# Patient Record
Sex: Male | Born: 1965 | Race: White | Hispanic: No | Marital: Married | Smoking: Never smoker
Health system: Southern US, Community
[De-identification: ages and names within clinical notes are randomized; demographics above are authoritative.]

## PROBLEM LIST (undated history)

## (undated) DIAGNOSIS — M5126 Other intervertebral disc displacement, lumbar region: Secondary | ICD-10-CM

## (undated) DIAGNOSIS — G43909 Migraine, unspecified, not intractable, without status migrainosus: Secondary | ICD-10-CM

## (undated) DIAGNOSIS — T7840XA Allergy, unspecified, initial encounter: Secondary | ICD-10-CM

## (undated) HISTORY — DX: Other intervertebral disc displacement, lumbar region: M51.26

## (undated) HISTORY — DX: Migraine, unspecified, not intractable, without status migrainosus: G43.909

## (undated) HISTORY — DX: Allergy, unspecified, initial encounter: T78.40XA

---

## 1999-05-13 ENCOUNTER — Other Ambulatory Visit: Admission: RE | Admit: 1999-05-13 | Discharge: 1999-05-22 | Payer: Self-pay | Admitting: Psychiatry

## 2000-06-25 ENCOUNTER — Encounter: Payer: Self-pay | Admitting: Family Medicine

## 2000-06-25 ENCOUNTER — Ambulatory Visit (HOSPITAL_COMMUNITY): Admission: RE | Admit: 2000-06-25 | Discharge: 2000-06-25 | Payer: Self-pay | Admitting: Family Medicine

## 2002-08-12 ENCOUNTER — Encounter: Payer: Self-pay | Admitting: Family Medicine

## 2002-08-12 ENCOUNTER — Ambulatory Visit (HOSPITAL_COMMUNITY): Admission: RE | Admit: 2002-08-12 | Discharge: 2002-08-12 | Payer: Self-pay | Admitting: Family Medicine

## 2003-07-26 ENCOUNTER — Ambulatory Visit (HOSPITAL_COMMUNITY): Admission: RE | Admit: 2003-07-26 | Discharge: 2003-07-26 | Payer: Self-pay | Admitting: Family Medicine

## 2016-08-15 ENCOUNTER — Other Ambulatory Visit: Payer: Self-pay | Admitting: Family Medicine

## 2016-08-15 ENCOUNTER — Ambulatory Visit
Admission: RE | Admit: 2016-08-15 | Discharge: 2016-08-15 | Disposition: A | Discharge status: Home / Self Care | Payer: BC Managed Care – PPO | Source: Ambulatory Visit | Attending: Family Medicine | Admitting: Family Medicine

## 2016-08-15 DIAGNOSIS — M25561 Pain in right knee: Secondary | ICD-10-CM

## 2021-03-18 ENCOUNTER — Ambulatory Visit (INDEPENDENT_AMBULATORY_CARE_PROVIDER_SITE_OTHER): Payer: BC Managed Care – PPO

## 2021-03-18 ENCOUNTER — Other Ambulatory Visit: Payer: Self-pay | Admitting: *Deleted

## 2021-03-18 ENCOUNTER — Telehealth: Payer: Self-pay | Admitting: *Deleted

## 2021-03-18 DIAGNOSIS — I493 Ventricular premature depolarization: Secondary | ICD-10-CM

## 2021-03-18 DIAGNOSIS — I498 Other specified cardiac arrhythmias: Secondary | ICD-10-CM

## 2021-03-18 NOTE — Telephone Encounter (Signed)
Explained to patient ZIO patch monitor will be mailed directly to his home.  [employer] will be contacted to please expedite. Reviewed monitor instructions.

## 2021-03-18 NOTE — Progress Notes (Unsigned)
Patient enrolled for [employer] to expedite a 3 day ZIO XT monitor to address on file

## 2021-03-20 DIAGNOSIS — I493 Ventricular premature depolarization: Secondary | ICD-10-CM

## 2021-03-20 DIAGNOSIS — I498 Other specified cardiac arrhythmias: Secondary | ICD-10-CM | POA: Diagnosis not present

## 2021-04-17 DIAGNOSIS — M5136 Other intervertebral disc degeneration, lumbar region: Secondary | ICD-10-CM | POA: Insufficient documentation

## 2021-04-17 DIAGNOSIS — Z8659 Personal history of other mental and behavioral disorders: Secondary | ICD-10-CM | POA: Insufficient documentation

## 2021-04-17 DIAGNOSIS — T7840XA Allergy, unspecified, initial encounter: Secondary | ICD-10-CM | POA: Insufficient documentation

## 2021-04-17 DIAGNOSIS — M5126 Other intervertebral disc displacement, lumbar region: Secondary | ICD-10-CM | POA: Insufficient documentation

## 2021-04-17 DIAGNOSIS — G43909 Migraine, unspecified, not intractable, without status migrainosus: Secondary | ICD-10-CM | POA: Insufficient documentation

## 2021-04-18 ENCOUNTER — Ambulatory Visit (INDEPENDENT_AMBULATORY_CARE_PROVIDER_SITE_OTHER): Payer: BC Managed Care – PPO | Admitting: Internal Medicine

## 2021-04-18 ENCOUNTER — Other Ambulatory Visit: Payer: Self-pay

## 2021-04-18 VITALS — BP 124/86 | HR 73 | Ht 77.0 in | Wt 213.4 lb

## 2021-04-18 DIAGNOSIS — I493 Ventricular premature depolarization: Secondary | ICD-10-CM | POA: Diagnosis not present

## 2021-04-18 NOTE — Patient Instructions (Signed)
Medication Instructions:  Your physician recommends that you continue on your current medications as directed. Please refer to the Current Medication list given to you today.  *If you need a refill on your cardiac medications before your next appointment, please call your pharmacy*   Lab Work: None ordered.  If you have labs (blood work) drawn today and your tests are completely normal, you will receive your results only by: MyChart Message (if you have MyChart) OR A paper copy in the mail If you have any lab test that is abnormal or we need to change your treatment, we will call you to review the results.   Testing/Procedures: Your physician has requested that you have an echocardiogram. Echocardiography is a painless test that uses sound waves to create images of your heart. It provides your doctor with information about the size and shape of your heart and how well your heart's chambers and valves are working. This procedure takes approximately one hour. There are no restrictions for this procedure.    Follow-Up: At [facility], you and your health needs are our priority.  As part of our continuing mission to provide you with exceptional heart care, we have created designated Provider Care Teams.  These Care Teams include your primary Cardiologist (physician) and Advanced Practice Providers (APPs -  Physician Assistants and Nurse Practitioners) who all work together to provide you with the care you need, when you need it.  We recommend signing up for the patient portal called "MyChart".  Sign up information is provided on this After Visit Summary.  MyChart is used to connect with patients for Virtual Visits (Telemedicine).  Patients are able to view lab/test results, encounter notes, upcoming appointments, etc.  Non-urgent messages can be sent to your provider as well.   To learn more about what you can do with MyChart, go to [email or web address].    Your next appointment:    Follow up with Dr Ladona Ridgel will be based on results of echo

## 2021-04-18 NOTE — Progress Notes (Signed)
      HPI Casey Chaney is referred by Dr. Clovis Riley for evaluation of PVC's. He is a pleasant and healthy 55 yo man with a h/o PVC's diagnosed a couple of months ago. He states that he underwent a very stressful period where he was consuming more caffeine and not getting enough sleep. Workup of his palpitations demonstrated NSR and PVC's on a Zio monitor with almost 17% PVC's. He did not have any sustained or NSVT. He feels better since then. He denies syncope. No chest pain or sob.  No Known Allergies   Current Outpatient Medications  Medication Sig Dispense Refill   Ascorbic Acid (VITAMIN C) 1000 MG tablet Take 1,000 mg by mouth daily.     loratadine (CLARITIN) 10 MG tablet Take 10 mg by mouth daily.     No current facility-administered medications for this visit.     Past Medical History:  Diagnosis Date   Allergies    Bulging lumbar disc    Migraine headache     ROS:   All systems reviewed and negative except as noted in the HPI.   No family history on file.   Social History   Socioeconomic History   Marital status: Married    Spouse name: Not on file   Number of children: Not on file   Years of education: Not on file   Highest education level: Not on file  Occupational History   Not on file  Tobacco Use   Smoking status: Never   Smokeless tobacco: Not on file  Substance and Sexual Activity   Alcohol use: Not on file   Drug use: Not on file   Sexual activity: Not on file  Other Topics Concern   Not on file  Social History Narrative   Not on file   Social Determinants of Health   Financial Resource Strain: Not on file  Food Insecurity: Not on file  Transportation Needs: Not on file  Physical Activity: Not on file  Stress: Not on file  Social Connections: Not on file  Intimate Partner Violence: Not on file     BP 124/86   Pulse 73   Ht 6\' 5"  (1.956 m)   Wt 213 lb 6.4 oz (96.8 kg)   SpO2 94%   BMI 25.31 kg/m   Physical Exam:  Well  appearing NAD HEENT: Unremarkable Neck:  No JVD, no thyromegally Lymphatics:  No adenopathy Back:  No CVA tenderness Lungs:  Clear with no wheezes HEART:  Regular rate rhythm, no murmurs, no rubs, no clicks Abd:  soft, positive bowel sounds, no organomegally, no rebound, no guarding Ext:  2 plus pulses, no edema, no cyanosis, no clubbing Skin:  No rashes no nodules Neuro:  CN II through XII intact, motor grossly intact  EKG - nsr  Assess/Plan:  PVC' s- His symptoms have improved. I suspect that the PVC's were related as much as anything to high adrenalin. He will undergo 2D echo to rule out any evidence of structural heart disease. As he is essentially asymptomatic, if his EF is normal I would not recommend any specific medical therapy at this time. He will undergo watchful waiting. If his echo is abnormal then additional testing would be warranted. , MD

## 2021-05-06 ENCOUNTER — Ambulatory Visit (HOSPITAL_COMMUNITY): Payer: BC Managed Care – PPO | Attending: Cardiology

## 2021-05-06 ENCOUNTER — Other Ambulatory Visit: Payer: Self-pay

## 2021-05-06 DIAGNOSIS — I493 Ventricular premature depolarization: Secondary | ICD-10-CM | POA: Insufficient documentation

## 2021-05-06 LAB — ECHOCARDIOGRAM COMPLETE
Area-P 1/2: 2.62 cm2
S' Lateral: 3.5 cm

## 2021-05-09 ENCOUNTER — Telehealth: Payer: Self-pay | Admitting: Internal Medicine

## 2021-05-09 NOTE — Telephone Encounter (Signed)
Patient returning call for echo results. He states he is in a dead zone and to leave a voicemail with results and recommendation.

## 2021-05-09 NOTE — Telephone Encounter (Signed)
The patient has been notified of the result and verbalized understanding.  All questions (if any) were answered.  One year recall with Dr. Ladona Ridgel, placed for this pt.

## 2021-05-09 NOTE — Telephone Encounter (Signed)
-----   Message from Marinus Maw, MD sent at 05/06/2021  9:27 PM EDT ----- EF is essentially normal. Have him folllowup with me in a year provided he remains asymptomatic. Avoid caffeine and ETOH in excess.

## 2021-05-13 ENCOUNTER — Other Ambulatory Visit: Payer: Self-pay | Admitting: Family Medicine

## 2021-05-13 DIAGNOSIS — R109 Unspecified abdominal pain: Secondary | ICD-10-CM

## 2021-05-20 ENCOUNTER — Ambulatory Visit
Admission: RE | Admit: 2021-05-20 | Discharge: 2021-05-20 | Disposition: A | Discharge status: Home / Self Care | Payer: BC Managed Care – PPO | Source: Ambulatory Visit | Attending: Family Medicine | Admitting: Family Medicine

## 2021-05-20 DIAGNOSIS — R109 Unspecified abdominal pain: Secondary | ICD-10-CM

## 2021-05-29 ENCOUNTER — Telehealth: Payer: Self-pay | Admitting: *Deleted

## 2021-05-29 NOTE — Telephone Encounter (Signed)
   [facility] Pre-operative Risk Assessment    Patient Name: Casey Chaney  DOB: 02-07-1966 MRN: [id number]  HEARTCARE STAFF:  - IMPORTANT!!!!!! Under Visit Info/Reason for Call, type in Other and utilize the format Clearance MM/DD/YY or Clearance TBD. Do not use dashes or single digits. - Please review there is not already an duplicate clearance open for this procedure. - If request is for dental extraction, please clarify the # of teeth to be extracted. - If the patient is currently at the dentist's office, call Pre-Op Callback Staff (MA/nurse) to input urgent request.  - If the patient is not currently in the dentist office, please route to the Pre-Op pool.  Request for surgical clearance:  What type of surgery is being performed?  LEFT KNEE SCOPE  When is this surgery scheduled?  07/04/21  What type of clearance is required (medical clearance vs. Pharmacy clearance to hold med vs. Both)?  MEDICAL  Are there any medications that need to be held prior to surgery and how long?  N/A  Practice name and name of physician performing surgery?  [facility] / DR. LANDAU  What is the office phone number?  [phone]   7.   What is the office fax number?  [phone] ATTN:  KELLY  8.   Anesthesia type (None, local, MAC, general) ?     Jeanann Lewandowsky 05/29/2021, 10:39 AM  _________________________________________________________________   (provider comments below)

## 2021-05-29 NOTE — Telephone Encounter (Signed)
   Patient Name: Casey Chaney  DOB: 1965/12/03 MRN: [id number]  Primary Cardiologist: EP - Dr. Lewayne Bunting  Chart reviewed as part of pre-operative protocol coverage. Patient has followed with Dr. Ladona Ridgel for history of PVCs (16.8% burden) by recent monitor. 2D echo 05/06/21 showed low-normal LVEF 50-55% and mild dilation of ascending aorta. No prior ischemic eval on file. Given PVC burden and low-normal LVEF, will route to Dr. Ladona Ridgel to find out if he is comfortable clearing patient for knee surgery as planned, or if he recommends further pre-operative cardiovascular testing. Dr. Ladona Ridgel - Please route response to P CV DIV PREOP (the pre-op pool). Thank you.   Laurann Montana, PA-C 05/29/2021, 1:05 PM

## 2021-05-30 NOTE — Telephone Encounter (Signed)
May proceed with knee surgery. Acceptable risk. GT

## 2022-04-01 IMAGING — US US ABDOMEN COMPLETE
1 series · 14 of 25 positions shown · non-contrast
Comparison: None.

CLINICAL DATA: Abdominal pain for 2 months

EXAM:
ABDOMEN ULTRASOUND COMPLETE

[Series 1: us abdomen complete · 0.20mm/px · 14 of 97 slices shown]
[im 1/97]
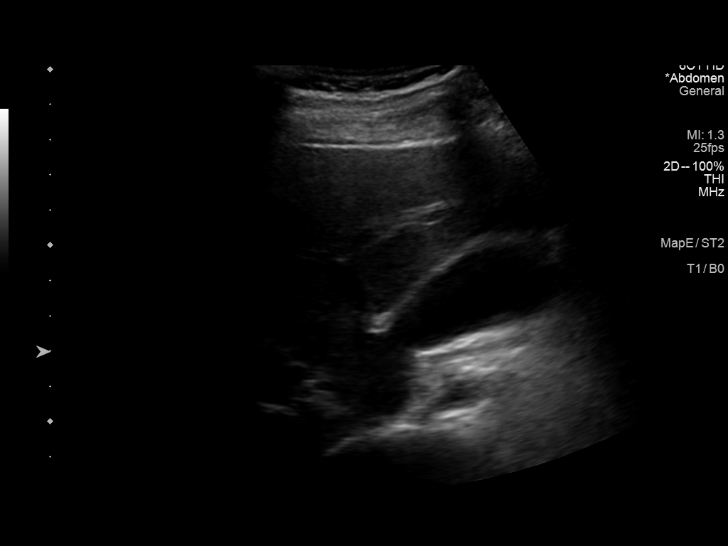
[im 9/97]
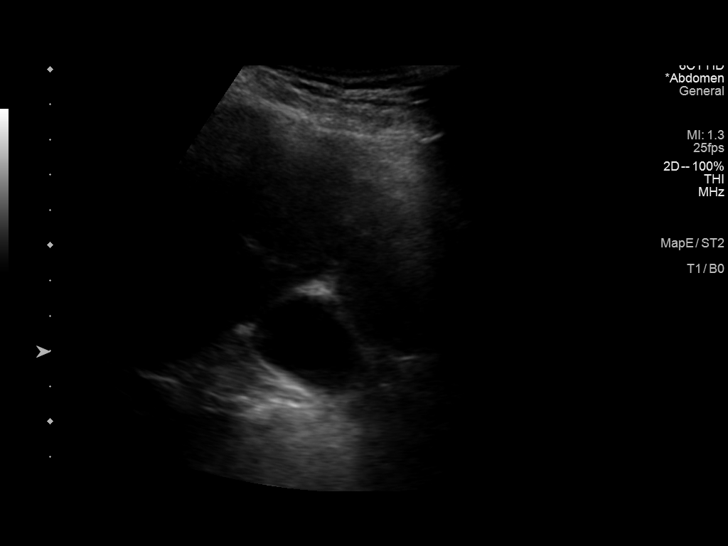
[im 17/97]
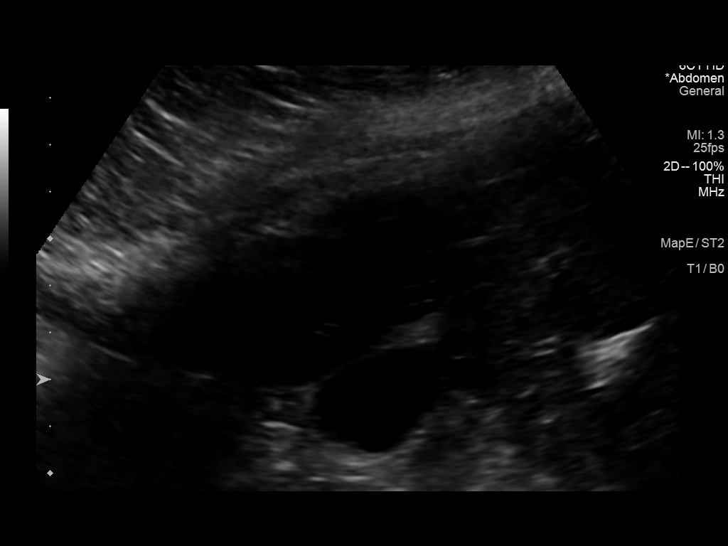
[im 25/97]
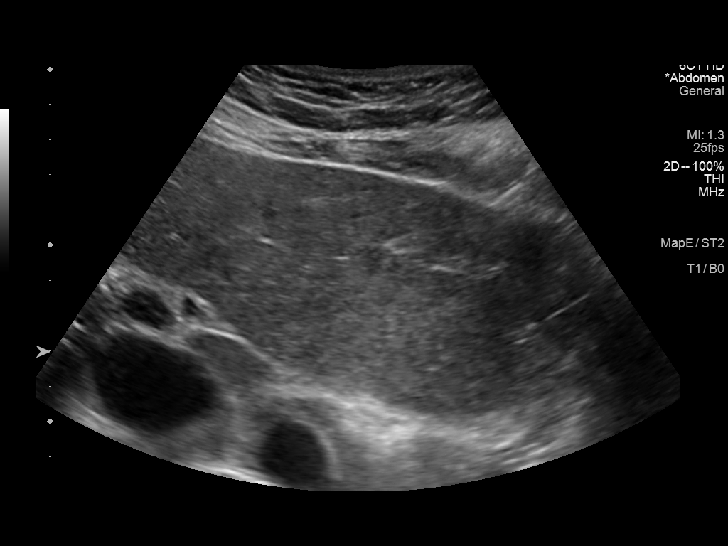
[im 33/97]
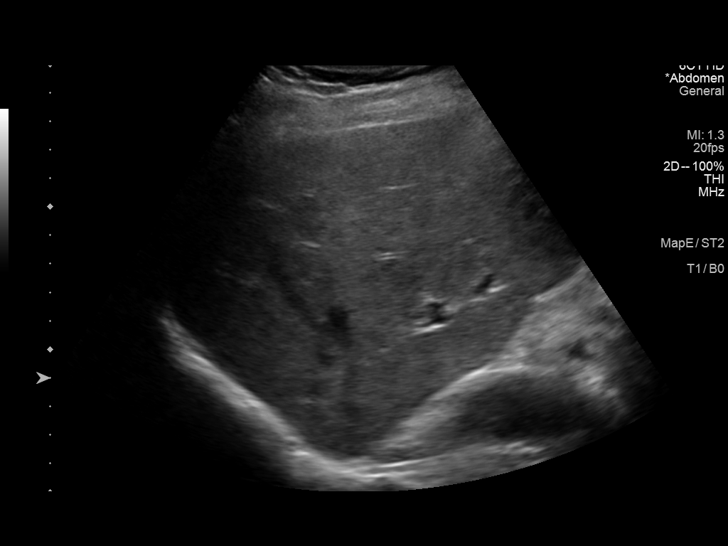
[im 37/97]
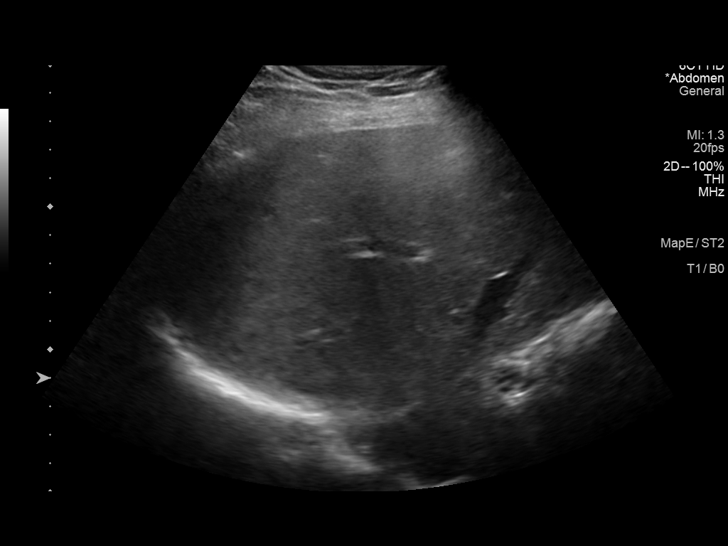
[im 45/97]
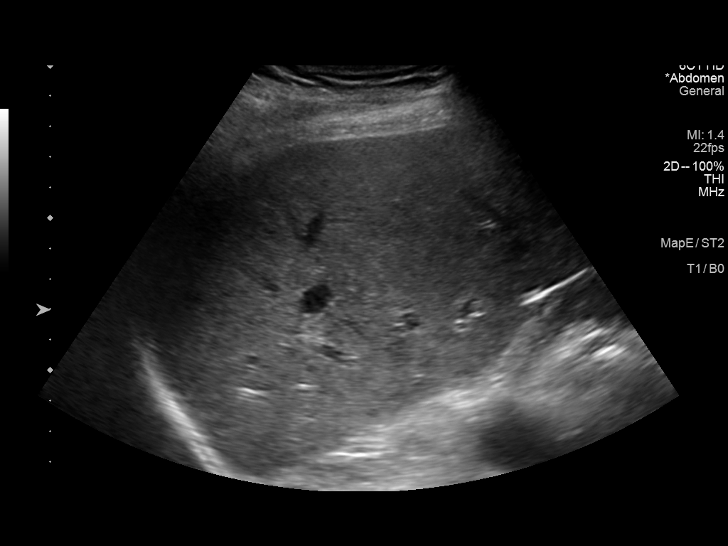
[im 53/97]
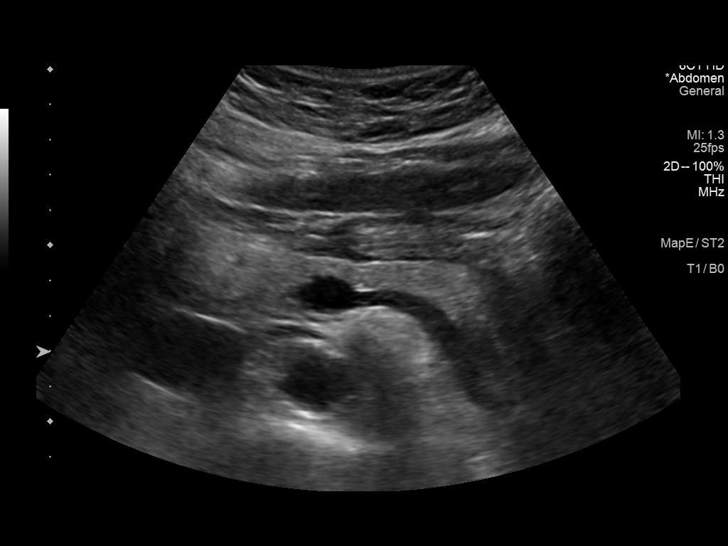
[im 61/97]
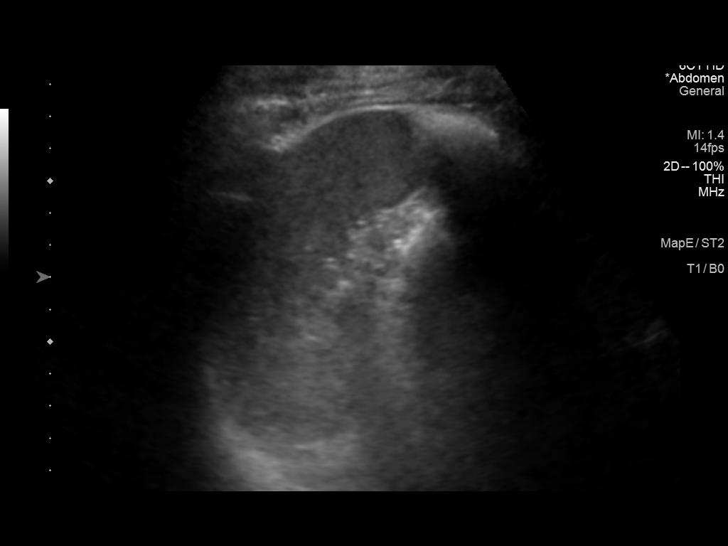
[im 65/97]
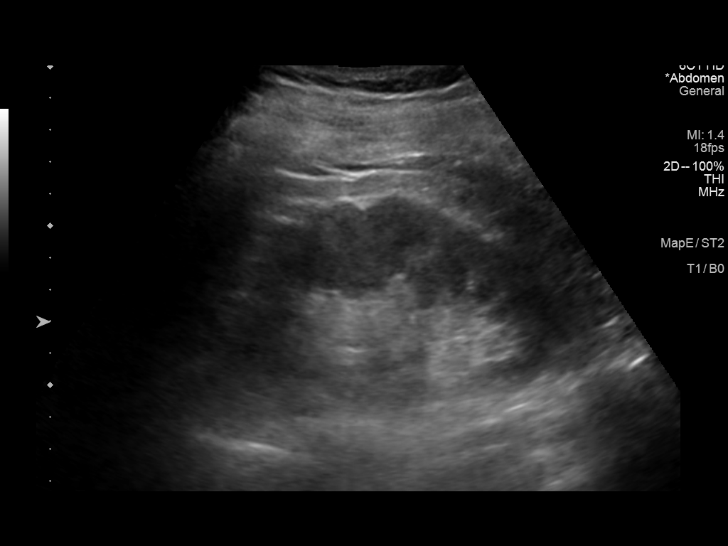
[im 73/97]
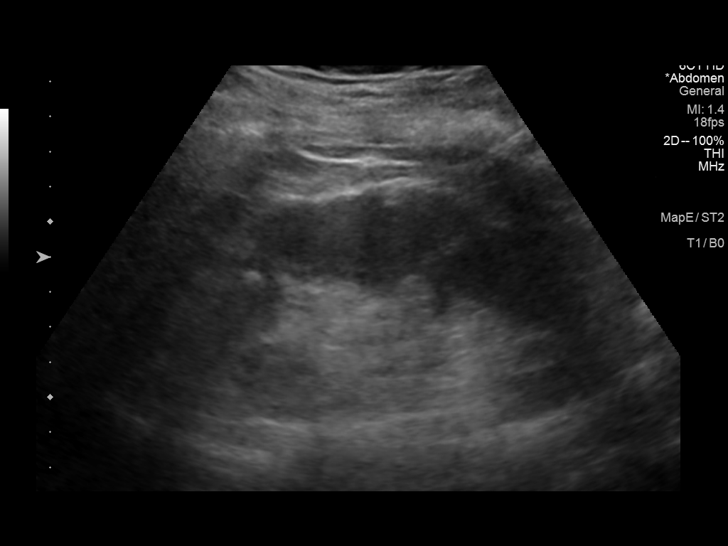
[im 81/97]
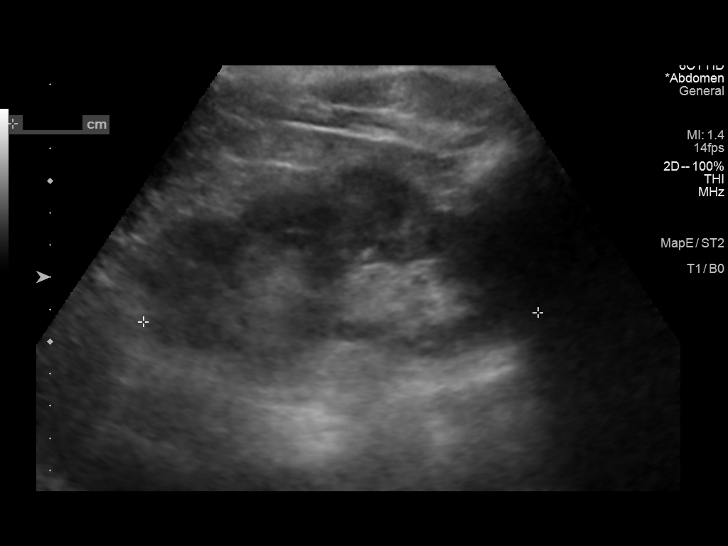
[im 89/97]
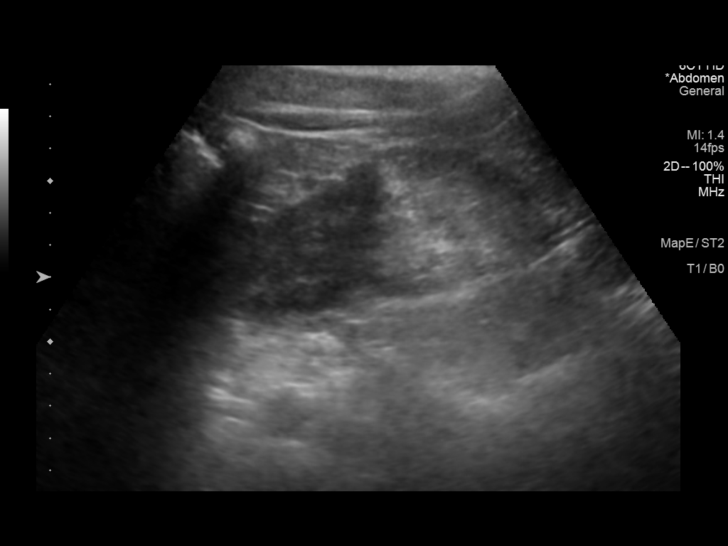
[im 97/97]
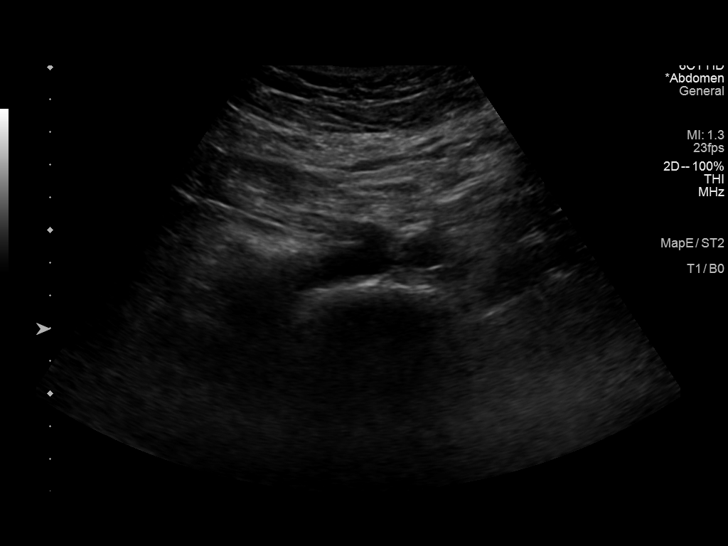

[14 of 25 positions shown; findings below may reference images not displayed]

FINDINGS: Gallbladder: No gallstones or wall thickening visualized. No
sonographic Murphy sign noted by sonographer.

Common bile duct: Diameter: 5 mm

Liver: No focal lesion identified. Within normal limits in
parenchymal echogenicity. Portal vein is patent on color Doppler
imaging with normal direction of blood flow towards the liver.

IVC: No abnormality visualized.

Pancreas: Visualized portion unremarkable.

Spleen: Size and appearance within normal limits.

Right Kidney: Length: 10.2 cm. Echogenicity within normal limits. No
mass or hydronephrosis visualized.

Left Kidney: Length: 12.3 cm. Echogenicity within normal limits. No
mass or hydronephrosis visualized.

Abdominal aorta: No aneurysm visualized.

Other findings: None.
IMPRESSION: 1. Unremarkable abdominal ultrasound.

## 2024-01-28 ENCOUNTER — Other Ambulatory Visit: Payer: Self-pay | Admitting: Family Medicine

## 2024-01-28 DIAGNOSIS — E782 Mixed hyperlipidemia: Secondary | ICD-10-CM

## 2024-02-17 ENCOUNTER — Ambulatory Visit (HOSPITAL_BASED_OUTPATIENT_CLINIC_OR_DEPARTMENT_OTHER)
Admission: RE | Admit: 2024-02-17 | Discharge: 2024-02-17 | Disposition: A | Discharge status: Home / Self Care | Payer: Self-pay | Source: Ambulatory Visit | Attending: Family Medicine | Admitting: Family Medicine

## 2024-02-17 DIAGNOSIS — E782 Mixed hyperlipidemia: Secondary | ICD-10-CM | POA: Insufficient documentation

## 2024-02-25 ENCOUNTER — Ambulatory Visit: Payer: Self-pay | Admitting: Internal Medicine
